# Patient Record
Sex: Female | Born: 1950 | Race: White | Hispanic: No | Marital: Married | Smoking: Never smoker
Health system: Southern US, Community
[De-identification: ages and names within clinical notes are randomized; demographics above are authoritative.]

## PROBLEM LIST (undated history)

## (undated) DIAGNOSIS — E785 Hyperlipidemia, unspecified: Secondary | ICD-10-CM

## (undated) DIAGNOSIS — I2583 Coronary atherosclerosis due to lipid rich plaque: Secondary | ICD-10-CM

## (undated) DIAGNOSIS — I2510 Atherosclerotic heart disease of native coronary artery without angina pectoris: Secondary | ICD-10-CM

## (undated) DIAGNOSIS — R002 Palpitations: Secondary | ICD-10-CM

## (undated) HISTORY — DX: Palpitations: R00.2

## (undated) HISTORY — DX: Hyperlipidemia, unspecified: E78.5

## (undated) HISTORY — DX: Coronary atherosclerosis due to lipid rich plaque: I25.83

## (undated) HISTORY — DX: Atherosclerotic heart disease of native coronary artery without angina pectoris: I25.10

---

## 1998-10-15 ENCOUNTER — Encounter: Payer: Self-pay | Admitting: Family Medicine

## 1998-10-15 ENCOUNTER — Ambulatory Visit (HOSPITAL_COMMUNITY): Admission: RE | Admit: 1998-10-15 | Discharge: 1998-10-15 | Payer: Self-pay | Admitting: Family Medicine

## 1999-03-24 ENCOUNTER — Encounter (INDEPENDENT_AMBULATORY_CARE_PROVIDER_SITE_OTHER): Payer: Self-pay

## 1999-03-24 ENCOUNTER — Other Ambulatory Visit: Admission: RE | Admit: 1999-03-24 | Discharge: 1999-03-24 | Payer: Self-pay | Admitting: Gastroenterology

## 1999-06-12 ENCOUNTER — Emergency Department (HOSPITAL_COMMUNITY): Admission: EM | Admit: 1999-06-12 | Discharge: 1999-06-12 | Payer: Self-pay | Admitting: Emergency Medicine

## 1999-06-12 ENCOUNTER — Encounter: Payer: Self-pay | Admitting: Emergency Medicine

## 1999-07-07 ENCOUNTER — Ambulatory Visit (HOSPITAL_COMMUNITY): Admission: RE | Admit: 1999-07-07 | Discharge: 1999-07-08 | Payer: Self-pay | Admitting: *Deleted

## 1999-07-07 HISTORY — PX: CARDIAC CATHETERIZATION: SHX172

## 1999-07-10 ENCOUNTER — Ambulatory Visit (HOSPITAL_COMMUNITY): Admission: RE | Admit: 1999-07-10 | Discharge: 1999-07-11 | Payer: Self-pay | Admitting: *Deleted

## 1999-07-10 HISTORY — PX: CARDIAC CATHETERIZATION: SHX172

## 2000-10-08 ENCOUNTER — Other Ambulatory Visit: Admission: RE | Admit: 2000-10-08 | Discharge: 2000-10-08 | Payer: Self-pay | Admitting: *Deleted

## 2001-01-31 ENCOUNTER — Other Ambulatory Visit: Admission: RE | Admit: 2001-01-31 | Discharge: 2001-01-31 | Payer: Self-pay | Admitting: *Deleted

## 2002-01-24 ENCOUNTER — Other Ambulatory Visit: Admission: RE | Admit: 2002-01-24 | Discharge: 2002-01-24 | Payer: Self-pay | Admitting: *Deleted

## 2002-06-20 ENCOUNTER — Ambulatory Visit (HOSPITAL_COMMUNITY): Admission: RE | Admit: 2002-06-20 | Discharge: 2002-06-20 | Payer: Self-pay | Admitting: Gastroenterology

## 2006-02-16 ENCOUNTER — Emergency Department (HOSPITAL_COMMUNITY): Admission: EM | Admit: 2006-02-16 | Discharge: 2006-02-16 | Payer: Self-pay | Admitting: Emergency Medicine

## 2006-04-03 ENCOUNTER — Encounter: Admission: RE | Admit: 2006-04-03 | Discharge: 2006-04-03 | Payer: Self-pay | Admitting: Family Medicine

## 2006-11-11 ENCOUNTER — Ambulatory Visit (HOSPITAL_BASED_OUTPATIENT_CLINIC_OR_DEPARTMENT_OTHER): Admission: RE | Admit: 2006-11-11 | Discharge: 2006-11-11 | Payer: Self-pay | Admitting: Orthopedic Surgery

## 2009-05-24 HISTORY — PX: CARDIOVASCULAR STRESS TEST: SHX262

## 2009-05-30 ENCOUNTER — Ambulatory Visit (HOSPITAL_COMMUNITY): Admission: RE | Admit: 2009-05-30 | Discharge: 2009-05-30 | Payer: Self-pay | Admitting: Obstetrics
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
## 2010-09-05 NOTE — Op Note (Signed)
NAME:  Anne Gaines, Anne Gaines                          ACCOUNT NO.:  [id number]   MEDICAL RECORD NO.:  [id number]                   PATIENT TYPE:  AMB   LOCATION:  ENDO                                 FACILITY:  [facility]   PHYSICIAN:  John C. Madilyn Fireman, M.D.                 DATE OF BIRTH:  July 08, 1950   DATE OF PROCEDURE:  06/20/2002  DATE OF DISCHARGE:                                 OPERATIVE REPORT   PROCEDURE:  Colonoscopy.   INDICATION FOR PROCEDURE:  Colon cancer screening in a 60 year old patient  who has also had bloating, constipation, and abdominal cramps.  Negative GYN  workup.   DESCRIPTION OF PROCEDURE:  The patient was placed in the left lateral  decubitus position and placed on the pulse monitor with continuous low-flow  oxygen delivered by nasal cannula oxygen.  She was sedated with 75 mcg IV  fentanyl and 8 mg IV Versed.  Once the video colonoscope was inserted into  the rectum and advanced to probably about the hepatic flexure, but the  procedure was incomplete due to poor and worsening prep and visualization  the further the scope was advanced toward the right colon.  Prep was so poor  in that area with non-transparent liquid stool as well as particulate matter  that could not be adequately removed, that I could not even tell for sure  whether I was in the cecum or not but could not clearly seen an ileocecal  valve.  I may have been at the hepatic flexure.  There as so much stool and  we were at the end of the cecum but there was such a sharp bend with stool  in front of it that I would not maintain an adequate view to pass the scope  further.  It was felt that we were probably about at the hepatic flexure.  The most proximal visualized portions of the colon were grossly normal,  severely limited by prep.  The prep improved as the scope was withdrawn more  into the descending and sigmoid colon and rectum, but I could not rule out  small lesions in the sigmoid and descending  colon less than 1 cm due to  limitations in prep as well.  No diverticula were noted.  The scope was then  withdrawn and the patient returned to the recovery room in stable condition.  She tolerated the procedure well and there were no immediate complications.   IMPRESSION:  Basically normal study, incomplete, estimated to the hepatic  flexure due to very poor prep.   PLAN:  Given poor prep and the patient's symptoms, I think she probably has  constipation-predominant irritable bowel syndrome, and will try her on a  course of Zelnorm.  Will consider a follow-up barium enema at some point for  screening.  John C. Madilyn Fireman, M.D.    JCH/MEDQ  D:  06/20/2002  T:  06/20/2002  Job:  [id number]   cc:   Georgina Peer, M.D.  [address]  [place]  [place] [address]  Fax: [phone]

## 2010-09-05 NOTE — Cardiovascular Report (Signed)
[place]. [facility]  Patient:    Anne Gaines, Anne Gaines                         MRN: [id number] Adm. Date:  [id number] Attending:  Mora Appl CC:         Meredith Staggers, M.D.                        Cardiac Catheterization  REFERRING PHYSICIAN:  Meredith Staggers, M.D.  INDICATION FOR PROCEDURE:  Recently status post angioplasty with stent deployment in the proximal LAD and recurrent chest pain.  DESCRIPTION OF PROCEDURE:  After obtaining written informed consent, the patient was brought to the cardiac catheterization lab in a post-absorptive state. Preop sedation was achieved using IV Versed.  The left femoral head was identified using radiographic technique.  The left femoral region was then prepped and draped in the usual sterile fashion.  Local anesthesia was achieved using 1% Xylocaine.  A 6-French hemostasis sheath was placed into the left femoral artery using a modified Seldinger technique.  Selective coronary angiography was performed using a JL4 nd a 5-French JR4 Judkins catheter.  Nonionic contrast was used and was hand-injected for the coronaries.  All catheter exchanges were made over a guidewire and the hemostasis sheath was flushed after each catheter exchange.  The films were reviewed.  There was no critical disease noted.  FINDINGS:  The aortic pressure was 90/60.  Fluoroscopy did not reveal any significant calcification of the coronaries.  CORONARY ANGIOGRAPHY:  The left main coronary artery bifurcated into the left anterior descending and the circumflex vessel.  There was no significant disease in the left main coronary artery.  Left anterior descending:  The left anterior descending gave rise to a small D-1, D-2, a large bifurcating D-3 and ended as a large apical recurrent branch.  The  previously stented region in the proximal LAD was patent.  The first septal perforators had a 90% ostial lesion and had decreased  flow.  Circumflex vessel:  The circumflex vessel gave rise to a small OM-1, a large OM-2, OM-3 and a moderate-sized OM-4.  There was no significant disease in the circumflex or its branches.  Right coronary artery:  The right coronary artery was a 2.0- to 2.5-mm size vessel. There was initial spasm with a 6-French catheter.  Sublingual spray of nitroglycerin and intracoronary nitroglycerin were given.  The right coronary artery was then engaged using the JR 5-French catheter.  There was no significant disease in the right coronary artery or its branches.  IMPRESSION:  Recurrent chest pain most likely related to the first septal perforator attempting to close.  The previously stented left anterior descending remained patent with excellent blood flow.  RECOMMENDATION:  Medical management. DD:  07/10/99 TD:  07/10/99 Job: [id number]

## 2010-09-05 NOTE — Cardiovascular Report (Signed)
[place]. [facility]  Patient:    Anne Gaines, Anne Gaines                         MRN: [id number] Adm. Date:  [id number] Attending:  Mora Appl CC:         Meredith Staggers, M.D.                        Cardiac Catheterization  REFERRING PHYSICIAN:  Meredith Staggers, M.D.  INDICATION FOR PROCEDURE:  A 60 year old female complains of substernal chest pain and reversible uptake in the anterior/anteroapical region on the Cardiolite image.  DESCRIPTION OF PROCEDURE:  After obtaining written informed consent, the patient was brought to the cardiac catheterization lab in a post-absorptive state. Preop sedation was achieved using IV Versed.  The right femoral head was identified using radiographic technique.  The right femoral region was then prepped and draped in the usual sterile fashion.  Local anesthesia was achieved using 1% Xylocaine.  6-French hemostasis sheath was placed into the right femoral artery using a modified Seldinger technique.  A single-plane ventriculogram was performed in the RAO position using a 6-French pigtail curved catheter.  Nonionic contrast was used and was power-injected for a total of 30 cc.  Selective coronary angiography was performed using the JL4 and JR4 Judkins catheters.  Nonionic contrast was used nd was hand-injected.  All catheter exchanges were made over a guidewire.  The hemostasis sheath was flashed after each catheter exchange.  Following review of the films, it was felt that there was a critical lesion involving the proximal AD. Dr.John Franco Nones was consulted and reviewed the films.  The patient was left prepped and draped for Dr. Amil Amen to proceed with angioplasty of the LAD.  FINDINGS:  The aortic pressure was 99/60.  LV pressure 95/4.  There was no gradient noted on pullback.  Single-plane ventriculogram revealed normal wall motion with no mitral regurgitation noted.  CORONARY ANGIOGRAPHY:  The  left main coronary artery bifurcated into the left anterior descending and circumflex vessel.  There was no significant disease in the left main coronary artery.  Left anterior descending:  Left anterior descending gave rise to a small D-1, small D-2, large bifurcating D-3 and ended as a large apical recurrent branch.  There was an 80% proximal lesion in the LAD, involving the first septal perforator.  The ostium of the septal perforator appeared to have an 80-90% lesion.  Circumflex vessel:  Circumflex vessel gave rise to a small OM-1, a large OM-2, M-3 and a moderate-size OM-4.  There was no significant disease in the circumflex or its branches.  Right coronary artery:  The right coronary artery was a 2.5-mm size vessel and ave rise to a small RV marginal 1, 2, and 3 and a moderate-size RV marginal #4 and V marginal #5 was also moderate in size.  The PDA and PL branch were rather small in size.  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  RECOMMENDATION:  Dr. Amil Amen will proceed with angioplasty of the LAD.  There were no regional wall motion abnormalities noted. DD:  07/07/99 TD:  07/07/99 Job: [id number]

## 2014-11-13 ENCOUNTER — Ambulatory Visit (INDEPENDENT_AMBULATORY_CARE_PROVIDER_SITE_OTHER): Payer: Self-pay | Admitting: Cardiovascular Disease

## 2014-11-13 ENCOUNTER — Encounter: Payer: Self-pay | Admitting: Cardiovascular Disease

## 2014-11-13 DIAGNOSIS — E785 Hyperlipidemia, unspecified: Secondary | ICD-10-CM

## 2014-11-13 DIAGNOSIS — Z79899 Other long term (current) drug therapy: Secondary | ICD-10-CM

## 2014-11-13 DIAGNOSIS — R002 Palpitations: Secondary | ICD-10-CM

## 2014-11-13 DIAGNOSIS — I2510 Atherosclerotic heart disease of native coronary artery without angina pectoris: Secondary | ICD-10-CM

## 2014-11-13 NOTE — Assessment & Plan Note (Signed)
History of hyperlipidemia not on statin therapy currently but with a prescription for that 5 years ago which apparently she never filled. She does have a history of ischemic heart disease. Her last LDL in our chart performed 05/23/09 was 131. I'm going to recheck a lipid and liver profile

## 2014-11-13 NOTE — Progress Notes (Signed)
     11/13/2014 Anne Gaines   [birth year]  [id number]  Primary Physician No primary care provider on file. Primary Cardiologist: Lorretta Harp MD [name]   HPI:  Anne Gaines is a 64 year old moderately overweight married Caucasian female whose husband is also a patient mind. I last saw her in the office 06/25/09. She has a history of CAD status post LAD stenting by Dr. Rodell Perna 2001. Other problems include hyperlipidemia. She denies chest pain or shortness of breath. Her last Myoview stress test performed 05/24/09 was nonischemic. She apparently was bitten by for ticks back in April and then developed episodes of tachycardia palpitations which peaked in June and have since resolved. She is worried about the possibility of Lyme disease and Lyme carditis.   Current Outpatient Prescriptions  Medication Sig Dispense Refill  . aspirin 81 MG tablet Take 81 mg by mouth daily.     No current facility-administered medications for this visit.    No Known Allergies  History   Social History  . Marital Status: Married    Spouse Name: N/A  . Number of Children: N/A  . Years of Education: N/A   Occupational History  . Not on file.   Social History Main Topics  . Smoking status: Never Smoker   . Smokeless tobacco: Not on file  . Alcohol Use: No  . Drug Use: No  . Sexual Activity: Not on file   Other Topics Concern  . Not on file   Social History Narrative     Review of Systems: General: negative for chills, fever, night sweats or weight changes.  Cardiovascular: negative for chest pain, dyspnea on exertion, edema, orthopnea, palpitations, paroxysmal nocturnal dyspnea or shortness of breath Dermatological: negative for rash Respiratory: negative for cough or wheezing Urologic: negative for hematuria Abdominal: negative for nausea, vomiting, diarrhea, bright red blood per rectum, melena, or hematemesis Neurologic: negative for visual changes, syncope, or  dizziness All other systems reviewed and are otherwise negative except as noted above.    Blood pressure 146/76, pulse 68, height 5\' 9"  (1.753 m), weight 195 lb 5 oz (88.593 kg).  General appearance: alert and no distress Neck: no adenopathy, no carotid bruit, no JVD, supple, symmetrical, trachea midline and thyroid not enlarged, symmetric, no tenderness/mass/nodules Lungs: clear to auscultation bilaterally Heart: regular rate and rhythm, S1, S2 normal, no murmur, click, rub or gallop Extremities: extremities normal, atraumatic, no cyanosis or edema  EKG normal sinus rhythm at 68 without ST or T-wave changes. I personally reviewed this EKG  ASSESSMENT AND PLAN:   Palpitations History of palpitations that began after several tick bites back in the spring. The symptoms were more noticeable last month but they have since resolved. I am worried about Lyme carditis. I'm going to order 2-D echo for LV function and get serum studies for Lyme disease.  Hyperlipidemia History of hyperlipidemia not on statin therapy currently but with a prescription for that 5 years ago which apparently she never filled. She does have a history of ischemic heart disease. Her last LDL in our chart performed 05/23/09 was 131. I'm going to recheck a lipid and liver profile  Coronary artery disease History of LAD stenting by Dr. Quay Burow back in 2001. She denies chest pain or shortness of breath.      Lorretta Harp MD FACP,FACC,FAHA, [facility] 11/13/2014 9:40 AM

## 2014-11-13 NOTE — Addendum Note (Signed)
Addended byChauncy Lean. on: 11/13/2014 02:02 PM   Modules accepted: Orders

## 2014-11-13 NOTE — Patient Instructions (Signed)
Your physician has requested that you have an echocardiogram. Echocardiography is a painless test that uses sound waves to create images of your heart. It provides your doctor with information about the size and shape of your heart and how well your heart's chambers and valves are working. This procedure takes approximately one hour. There are no restrictions for this procedure.  Your physician recommends that you return for lab work at your earliest [place].  Dr Gwenlyn Found recommends that you schedule a follow-up appointment in 1 year. You will receive a reminder letter in the mail two months in advance. If you don't receive a letter, please call our office to schedule the follow-up appointment.

## 2014-11-13 NOTE — Assessment & Plan Note (Signed)
History of palpitations that began after several tick bites back in the spring. The symptoms were more noticeable last month but they have since resolved. I am worried about Lyme carditis. I'm going to order 2-D echo for LV function and get serum studies for Lyme disease.

## 2014-11-13 NOTE — Assessment & Plan Note (Signed)
History of LAD stenting by Dr. Quay Burow back in 2001. She denies chest pain or shortness of breath.

## 2014-11-15 ENCOUNTER — Telehealth: Payer: Self-pay | Admitting: Cardiovascular Disease

## 2014-11-15 NOTE — Telephone Encounter (Signed)
Returning a call from [place]

## 2014-11-15 NOTE — Telephone Encounter (Signed)
Patient provided with the following information - Referral to Dr. Bobby Rumpf, Infectious Diseases, was sent on 7/26. Called their office [phone]) to confirm that patient's referral was in process. Verified that the MD is reviewing request. If referral accepted, they will call the patient to schedule appointment.  If referral request not accepted, they will call Dr. Kennon Holter office back to inform him. Patient verbalized understanding and will await phone call from Dr. Algis Downs office.

## 2014-11-15 NOTE — Telephone Encounter (Signed)
Pt thought the nurse was going to make her an appt  with an infectious disease doctor.This was so they could check to see if she had lyme disease.

## 2014-11-15 NOTE — Telephone Encounter (Signed)
LMTCB 7/28

## 2014-11-26 ENCOUNTER — Telehealth: Payer: Self-pay | Admitting: Cardiovascular Disease

## 2014-11-26 DIAGNOSIS — W57XXXA Bitten or stung by nonvenomous insect and other nonvenomous arthropods, initial encounter: Secondary | ICD-10-CM

## 2014-11-26 DIAGNOSIS — R002 Palpitations: Secondary | ICD-10-CM

## 2014-11-26 NOTE — Telephone Encounter (Signed)
Pt called in stating that she was referred to an Infectious Disease doctor for her [place] Lyme disease . But she was told that she had to have her blood work done here before going to see the ID doctor. She would like to know if those orders could be put in by Thursday when goes to have her other labs done. Please f/u  Thanks

## 2014-11-26 NOTE — Telephone Encounter (Signed)
Patient notified lab has been ordered to be done at [facility]. She will have her lipid/liver panel done at the same time. She is aware she will need to be fasting.

## 2014-11-26 NOTE — Telephone Encounter (Signed)
Message routed to Dr. Gwenlyn Found and Curt Bears, RN to advise on lab work

## 2014-11-26 NOTE — Telephone Encounter (Signed)
Order placed for the lab work.

## 2014-11-29 ENCOUNTER — Encounter: Payer: Self-pay | Admitting: *Deleted

## 2014-11-29 ENCOUNTER — Encounter (INDEPENDENT_AMBULATORY_CARE_PROVIDER_SITE_OTHER): Payer: Self-pay

## 2014-11-29 ENCOUNTER — Other Ambulatory Visit: Payer: Self-pay

## 2014-11-29 ENCOUNTER — Ambulatory Visit (HOSPITAL_COMMUNITY): Payer: Self-pay | Attending: Internal Medicine

## 2014-11-29 DIAGNOSIS — Z8249 Family history of ischemic heart disease and other diseases of the circulatory system: Secondary | ICD-10-CM | POA: Insufficient documentation

## 2014-11-29 DIAGNOSIS — R002 Palpitations: Secondary | ICD-10-CM

## 2014-11-29 DIAGNOSIS — I5189 Other ill-defined heart diseases: Secondary | ICD-10-CM | POA: Insufficient documentation

## 2014-11-29 DIAGNOSIS — E785 Hyperlipidemia, unspecified: Secondary | ICD-10-CM | POA: Insufficient documentation

## 2014-11-30 ENCOUNTER — Other Ambulatory Visit: Payer: Self-pay | Admitting: Cardiovascular Disease

## 2014-12-01 LAB — LIPID PANEL
CHOLESTEROL: 237 mg/dL — AB (ref 125–200)
HDL: 50 mg/dL (ref >=46)
LDL CALC: 155 mg/dL — AB (ref <130)
TRIGLYCERIDES: 158 mg/dL — AB (ref <150)
Total CHOL/HDL Ratio: 4.7 Ratio (ref <=5.0)
VLDL: 32 mg/dL — AB (ref <30)

## 2014-12-01 LAB — HEPATIC FUNCTION PANEL
ALBUMIN: 4.1 g/dL (ref 3.6–5.1)
ALK PHOS: 58 U/L (ref 33–130)
ALT: 23 U/L (ref 6–29)
AST: 19 U/L (ref 10–35)
BILIRUBIN DIRECT: 0.1 mg/dL (ref <=0.2)
BILIRUBIN TOTAL: 0.6 mg/dL (ref 0.2–1.2)
Indirect Bilirubin: 0.5 mg/dL (ref 0.2–1.2)
Total Protein: 6.6 g/dL (ref 6.1–8.1)

## 2014-12-04 ENCOUNTER — Telehealth: Payer: Self-pay | Admitting: Cardiovascular Disease

## 2014-12-04 DIAGNOSIS — Z79899 Other long term (current) drug therapy: Secondary | ICD-10-CM

## 2014-12-04 DIAGNOSIS — E785 Hyperlipidemia, unspecified: Secondary | ICD-10-CM

## 2014-12-04 LAB — LYME ABY, WSTRN BLT IGG & IGM W/BANDS
B BURGDORFERI IGM ABS (IB): NEGATIVE
B burgdorferi IgG Abs (IB): NEGATIVE
LYME DISEASE 18 KD IGG: NONREACTIVE
LYME DISEASE 23 KD IGG: NONREACTIVE
LYME DISEASE 23 KD IGM: NONREACTIVE
LYME DISEASE 39 KD IGM: NONREACTIVE
LYME DISEASE 41 KD IGG: NONREACTIVE
LYME DISEASE 58 KD IGG: NONREACTIVE
Lyme Disease 28 kD IgG: NONREACTIVE
Lyme Disease 30 kD IgG: NONREACTIVE
Lyme Disease 39 kD IgG: NONREACTIVE
Lyme Disease 41 kD IgM: NONREACTIVE
Lyme Disease 45 kD IgG: NONREACTIVE
Lyme Disease 66 kD IgG: NONREACTIVE
Lyme Disease 93 kD IgG: NONREACTIVE

## 2014-12-04 NOTE — Telephone Encounter (Signed)
Patient said someone from here called her today between 1:30 pm and 2pm.  She is returning call.  I don't see where we have called her.

## 2014-12-04 NOTE — Telephone Encounter (Signed)
Mrs.Picou is returning the call . Please call . Thanks

## 2014-12-05 MED ORDER — ATORVASTATIN CALCIUM 40 MG PO TABS: 40.0000 mg | ORAL_TABLET | Freq: Every day | ORAL | Status: DC

## 2014-12-05 NOTE — Telephone Encounter (Signed)
Patient was returning call regarding lab results.   Spoke with her and notified her of results/med instructions and that labs would need to be repeated in 2 months. She voiced understanding.

## 2014-12-10 ENCOUNTER — Encounter: Payer: Self-pay | Admitting: Cardiovascular Disease

## 2014-12-13 ENCOUNTER — Telehealth: Payer: Self-pay | Admitting: *Deleted

## 2014-12-13 NOTE — Telephone Encounter (Signed)
No need to see, her Lyme serologies are all negative/normal.         Thanks    jeff    LM for patient to call me regarding her test results.

## 2014-12-20 ENCOUNTER — Encounter: Payer: Self-pay | Admitting: *Deleted

## 2014-12-20 NOTE — Telephone Encounter (Signed)
Message sent to patient through my chart.  

## 2014-12-21 ENCOUNTER — Telehealth: Payer: Self-pay | Admitting: Cardiovascular Disease

## 2014-12-21 NOTE — Telephone Encounter (Signed)
Patient reports all the acute issues she has been having following her tick bite in April have abated.  Is there anything further she is recommended to do other than routine f/u visit?

## 2014-12-21 NOTE — Telephone Encounter (Signed)
Pt called and said echo came back normal,lab work was good,and palpitations have stopped.What is the next step or recommendation?

## 2014-12-24 NOTE — Telephone Encounter (Signed)
Nothing further.

## 2014-12-25 NOTE — Telephone Encounter (Signed)
Dr. Kennon Holter recommendations communicated to patient.

## 2016-03-31 ENCOUNTER — Ambulatory Visit: Payer: Self-pay | Admitting: Cardiovascular Disease

## 2016-05-01 ENCOUNTER — Ambulatory Visit (INDEPENDENT_AMBULATORY_CARE_PROVIDER_SITE_OTHER): Payer: Medicare Other | Admitting: Cardiovascular Disease

## 2016-05-01 ENCOUNTER — Encounter: Payer: Self-pay | Admitting: Cardiovascular Disease

## 2016-05-01 VITALS — BP 133/76 | HR 65 | Ht 69.0 in | Wt 189.2 lb

## 2016-05-01 DIAGNOSIS — E785 Hyperlipidemia, unspecified: Secondary | ICD-10-CM

## 2016-05-01 DIAGNOSIS — I2583 Coronary atherosclerosis due to lipid rich plaque: Secondary | ICD-10-CM | POA: Diagnosis not present

## 2016-05-01 DIAGNOSIS — I2510 Atherosclerotic heart disease of native coronary artery without angina pectoris: Secondary | ICD-10-CM

## 2016-05-01 LAB — LIPID PANEL
CHOL/HDL RATIO: 4.4 ratio (ref <5.0)
Cholesterol: 237 mg/dL — ABNORMAL HIGH (ref <200)
HDL: 54 mg/dL (ref >50)
LDL Cholesterol: 152 mg/dL — ABNORMAL HIGH (ref <100)
Triglycerides: 157 mg/dL — ABNORMAL HIGH (ref <150)
VLDL: 31 mg/dL — AB (ref <30)

## 2016-05-01 LAB — HEPATIC FUNCTION PANEL
ALT: 22 U/L (ref 6–29)
AST: 17 U/L (ref 10–35)
Albumin: 4.3 g/dL (ref 3.6–5.1)
Alkaline Phosphatase: 66 U/L (ref 33–130)
BILIRUBIN DIRECT: 0.1 mg/dL (ref <=0.2)
BILIRUBIN TOTAL: 0.6 mg/dL (ref 0.2–1.2)
Indirect Bilirubin: 0.5 mg/dL (ref 0.2–1.2)
Total Protein: 6.8 g/dL (ref 6.1–8.1)

## 2016-05-01 NOTE — Assessment & Plan Note (Signed)
History of CAD status post LAD stenting by Dr. Rodell Perna in 2001. Her last Myoview performed 05/24/09 was nonischemic. She denies chest pain or shortness of breath.

## 2016-05-01 NOTE — Patient Instructions (Signed)

## 2016-05-01 NOTE — Assessment & Plan Note (Signed)
History of hyperlipidemia currently not on a statin drug to her left liver profile performed 11/30/14 revealed total cholesterol 237, LDL 155 and HDL of 50. We will recheck a lipid and liver profile

## 2016-05-01 NOTE — Progress Notes (Signed)
     05/01/2016 Anne Gaines   1950-08-01  [id number]  Primary Physician No primary care provider on file. Primary Cardiologist: Anne Harp MD Anne Gaines  HPI:  Anne Gaines is a 66 year old moderately overweight married Caucasian female whose husband Anne Gaines is also a patient mind. I last saw her in the office 06/25/09. She has a history of CAD status post LAD stenting by Dr. Rodell Gaines 2001. Other problems include hyperlipidemia. She denies chest pain or shortness of breath. Her last Myoview stress test performed 05/24/09 was nonischemic. She apparently was bitten by for ticks back in April 2016 and then developed episodes of tachycardia palpitations which peaked in June and have since resolved. Since I saw her last she denies chest pain or shortness of breath.   Current Outpatient Prescriptions  Medication Sig Dispense Refill  . aspirin 81 MG tablet Take 81 mg by mouth daily.     No current facility-administered medications for this visit.     No Known Allergies  Social History   Social History  . Marital status: Married    Spouse name: N/A  . Number of children: N/A  . Years of education: N/A   Occupational History  . Not on file.   Social History Main Topics  . Smoking status: Never Smoker  . Smokeless tobacco: Not on file  . Alcohol use No  . Drug use: No  . Sexual activity: Not on file   Other Topics Concern  . Not on file   Social History Narrative  . No narrative on file     Review of Systems: General: negative for chills, fever, night sweats or weight changes.  Cardiovascular: negative for chest pain, dyspnea on exertion, edema, orthopnea, palpitations, paroxysmal nocturnal dyspnea or shortness of breath Dermatological: negative for rash Respiratory: negative for cough or wheezing Urologic: negative for hematuria Abdominal: negative for nausea, vomiting, diarrhea, bright red blood per rectum, melena, or hematemesis Neurologic: negative for  visual changes, syncope, or dizziness All other systems reviewed and are otherwise negative except as noted above.    Blood pressure 133/76, pulse 65, height 5\' 9"  (1.753 m), weight 189 lb 3.2 oz (85.8 kg).  General appearance: alert and no distress Neck: no adenopathy, no carotid bruit, no JVD, supple, symmetrical, trachea midline and thyroid not enlarged, symmetric, no tenderness/mass/nodules Lungs: clear to auscultation bilaterally Heart: regular rate and rhythm, S1, S2 normal, no murmur, click, rub or gallop Extremities: extremities normal, atraumatic, no cyanosis or edema  EKG sinus rhythm at 65 without ST or T-wave changes.. I personally reviewed this EKG  ASSESSMENT AND PLAN:   Coronary artery disease History of CAD status post LAD stenting by Dr. Rodell Gaines in 2001. Her last Myoview performed 05/24/09 was nonischemic. She denies chest pain or shortness of breath.  Hyperlipidemia History of hyperlipidemia currently not on a statin drug to her left liver profile performed 11/30/14 revealed total cholesterol 237, LDL 155 and HDL of 50. We will recheck a lipid and liver profile      Anne Harp MD [facility], [facility] 05/01/2016 8:23 AM

## 2016-05-05 ENCOUNTER — Telehealth: Payer: Self-pay | Admitting: Cardiovascular Disease

## 2016-05-05 NOTE — Telephone Encounter (Signed)
New Message     Returning your call about the lab results

## 2016-05-11 ENCOUNTER — Other Ambulatory Visit: Payer: Self-pay | Admitting: Cardiovascular Disease

## 2016-05-11 DIAGNOSIS — E785 Hyperlipidemia, unspecified: Secondary | ICD-10-CM

## 2016-05-11 MED ORDER — ATORVASTATIN CALCIUM 40 MG PO TABS: 40.0000 mg | ORAL_TABLET | Freq: Every day | ORAL | 3 refills | Status: AC

## 2016-05-11 NOTE — Telephone Encounter (Signed)
Spoke to pt. Pt verbalized understanding. Per Dr. Kennon Holter instruction, sent in Rx for Lipitor 40 mg to [facility] on [facility] [address]. Dahlia Bailiff lab slips to pt to have labs rechecked in 3 months. Will refer to pharmacy if pt is statin intolerant.

## 2016-06-02 DIAGNOSIS — L259 Unspecified contact dermatitis, unspecified cause: Secondary | ICD-10-CM | POA: Diagnosis not present

## 2016-09-12 ENCOUNTER — Emergency Department (HOSPITAL_COMMUNITY)
Admission: EM | Admit: 2016-09-12 | Discharge: 2016-09-12 | Disposition: A | Discharge status: Home / Self Care | Payer: Medicare Other | Attending: Emergency Medicine | Admitting: Emergency Medicine

## 2016-09-12 ENCOUNTER — Emergency Department (HOSPITAL_COMMUNITY): Payer: Medicare Other

## 2016-09-12 ENCOUNTER — Encounter (HOSPITAL_COMMUNITY): Payer: Self-pay | Admitting: Emergency Medicine

## 2016-09-12 DIAGNOSIS — Y999 Unspecified external cause status: Secondary | ICD-10-CM | POA: Diagnosis not present

## 2016-09-12 DIAGNOSIS — S52502A Unspecified fracture of the lower end of left radius, initial encounter for closed fracture: Secondary | ICD-10-CM | POA: Insufficient documentation

## 2016-09-12 DIAGNOSIS — S52612A Displaced fracture of left ulna styloid process, initial encounter for closed fracture: Secondary | ICD-10-CM

## 2016-09-12 DIAGNOSIS — Y9301 Activity, walking, marching and hiking: Secondary | ICD-10-CM | POA: Diagnosis not present

## 2016-09-12 DIAGNOSIS — Y92017 Garden or yard in single-family (private) house as the place of occurrence of the external cause: Secondary | ICD-10-CM | POA: Diagnosis not present

## 2016-09-12 DIAGNOSIS — S6992XA Unspecified injury of left wrist, hand and finger(s), initial encounter: Secondary | ICD-10-CM | POA: Diagnosis present

## 2016-09-12 DIAGNOSIS — W01198A Fall on same level from slipping, tripping and stumbling with subsequent striking against other object, initial encounter: Secondary | ICD-10-CM | POA: Diagnosis not present

## 2016-09-12 DIAGNOSIS — Z7982 Long term (current) use of aspirin: Secondary | ICD-10-CM | POA: Diagnosis not present

## 2016-09-12 DIAGNOSIS — S52592A Other fractures of lower end of left radius, initial encounter for closed fracture: Secondary | ICD-10-CM | POA: Diagnosis not present

## 2016-09-12 DIAGNOSIS — S52602A Unspecified fracture of lower end of left ulna, initial encounter for closed fracture: Secondary | ICD-10-CM | POA: Diagnosis not present

## 2016-09-12 MED ORDER — NAPROXEN 500 MG PO TABS: 500.0000 mg | ORAL_TABLET | Freq: Two times a day (BID) | ORAL | 0 refills | Status: AC | PRN

## 2016-09-12 MED ORDER — HYDROCODONE-ACETAMINOPHEN 5-325 MG PO TABS: 1.0000 | ORAL_TABLET | Freq: Four times a day (QID) | ORAL | 0 refills | Status: AC | PRN

## 2016-09-12 MED ORDER — HYDROCODONE-ACETAMINOPHEN 5-325 MG PO TABS
1.0000 | ORAL_TABLET | Freq: Once | ORAL | Status: AC
Start: 2016-09-12 — End: 2016-09-12
  Administered 2016-09-12: 1 via ORAL
  Filled 2016-09-12: qty 1

## 2016-09-12 NOTE — ED Triage Notes (Signed)
Patient c/o left wrist pain after tripping and falling today. Denies head injury and LOC. Swelling noted.

## 2016-09-12 NOTE — Discharge Instructions (Signed)
Wear wrist splint at all times until you see the hand specialist. Ice and elevate wrist throughout the day, using ice pack for no more than 20 minutes every hour.  Alternate between naprosyn and norco as needed for pain relief. Do not drive or operate machinery with pain medication use. Call hand specialist follow up today or tomorrow to schedule followup appointment for Tuesday 09/15/16 at [place] [facility] (call at 8am to make sure they have your appointment scheduled, but they should be expecting you) for ongoing management of your wrist injury. Return to the ER for changes or worsening symptoms.

## 2016-09-12 NOTE — ED Provider Notes (Signed)
[facility] DEPT Provider Note   CSN: [id number] Arrival date & time: 09/12/16  1308  By signing my name below, I, Marcello Moores, attest that this documentation has been prepared under the direction and in the presence of [address] [place], [place] Electronically Signed: Marcello Moores, ED Scribe. 09/12/16. 2:15 PM.  History   Chief Complaint Chief Complaint  Patient presents with  . Arm Injury   The history is provided by the patient and medical records. No language interpreter was used.  Arm Injury   This is a new problem. The current episode started 1 to 2 hours ago. The problem occurs constantly. The problem has been gradually worsening. The pain is present in the left wrist. The quality of the pain is described as aching. The pain is at a severity of 10/10. The pain is severe. Associated symptoms include limited range of motion. Pertinent negatives include no numbness and no tingling. The symptoms are aggravated by activity. She has tried nothing for the symptoms. The treatment provided no relief. There has been a history of trauma.      HPI Comments: Anne Gaines is a 66 y.o. female who presents to the Emergency Department complaining of L wrist injury s/p mechanical fall; states she was in her yard and walking backwards around 12:30pm (~1.5hrs prior to eval), when she tripped over a log and fell onto her L buttock and with her L wrist stretched out behind her. Reports L wrist pain that she describes as 10/10 constant aching, gradually worsening L wrist pain with radiation to the elbow, worse with L wrist movement, and unrelieved with home oil remedies and ice use. No other tx tried PTA. The pt reports associated swelling and bruising at the L wrist. The pt denies head injury or LOC. Not on blood thinners, no known bleeding disorders. Her last meal was at 10:30am today. Pt denies numbness, tingling, focal weakness, head inj, LOC, incontinence of urine/stool, saddle anesthesia/cauda  equina symptoms, or any other complaints/injuries at this time.   Past Medical History:  Diagnosis Date  . Coronary artery disease due to lipid rich plaque   . Hyperlipidemia   . Palpitations     Patient Active Problem List   Diagnosis Date Noted  . Coronary artery disease 11/13/2014  . Hyperlipidemia 11/13/2014  . Palpitations 11/13/2014    Past Surgical History:  Procedure Laterality Date  . CARDIAC CATHETERIZATION  07/07/1999   Critical disease involving the proximal LAD (80%) and ostium of the first circ (80-90%)  . CARDIAC CATHETERIZATION  07/10/1999   No intervention - medication management  . CARDIOVASCULAR STRESS TEST  05/24/2009   normal pattern of perfusion in all regions, no scintipgraphic evidence of inducible myocardial ischemia, EKG negative for ischemia, no ECG changes.    OB History    No data available       Home Medications    Prior to Admission medications   Medication Sig Start Date End Date Taking? Authorizing Provider  aspirin 81 MG tablet Take 81 mg by mouth daily.    [provider]  atorvastatin (LIPITOR) 40 MG tablet Take 1 tablet (40 mg total) by mouth daily. 05/11/16 08/09/16  Lorretta Harp, MD    Family History Family History  Problem Relation Age of Onset  . Heart disease Mother   . Emphysema Mother   . Hypertension Father   . Heart failure Father   . Diabetes Brother   . Hypertension Brother   . Diabetes Brother   .  Hepatitis C Brother   . Emphysema Maternal Grandmother   . Heart Problems Paternal Grandmother   . Heart failure Paternal Grandfather   . Heart Problems Paternal Grandfather     Social History Social History  Substance Use Topics  . Smoking status: Never Smoker  . Smokeless tobacco: Not on file  . Alcohol use No     Allergies   Patient has no known allergies.   Review of Systems Review of Systems  HENT: Negative for facial swelling (no head inj).   Genitourinary: Negative for difficulty  urinating (no incontinence).  Musculoskeletal: Positive for arthralgias and joint swelling. Negative for myalgias.  Skin: Positive for color change (bruising L wrist). Negative for wound.  Allergic/Immunologic: Negative for immunocompromised state.  Neurological: Negative for tingling, syncope, weakness and numbness.  Hematological: Does not bruise/bleed easily.  Psychiatric/Behavioral: Negative for confusion.     Physical Exam Updated Vital Signs BP (!) 144/87 (BP Location: Right Arm)   Pulse 84   Temp 98.4 F (36.9 C) (Oral)   Resp 18   SpO2 97%   Physical Exam  Constitutional: She is oriented to person, place, and time. Vital signs are normal. She appears well-developed and well-nourished.  Non-toxic appearance. No distress.  Afebrile, nontoxic, NAD  HENT:  Head: Normocephalic and atraumatic.  Mouth/Throat: Mucous membranes are normal.  Eyes: Conjunctivae and EOM are normal. Right eye exhibits no discharge. Left eye exhibits no discharge.  Neck: Normal range of motion. Neck supple. No spinous process tenderness and no muscular tenderness present. No neck rigidity. Normal range of motion present.  FROM intact without spinous process TTP, no bony stepoffs or deformities, no paraspinous muscle TTP or muscle spasms. No rigidity or meningeal signs. No bruising or swelling.  Cardiovascular: Normal rate and intact distal pulses.   Pulmonary/Chest: Effort normal. No respiratory distress.  Abdominal: Normal appearance. She exhibits no distension.  Musculoskeletal:       Left wrist: She exhibits decreased range of motion, tenderness, bony tenderness, swelling and crepitus. She exhibits no deformity and no laceration.       Lumbar back: Normal.  C-spine as above, all other spinal levels no TTP without bony stepoffs or deformities. Buttock without bruising or focal bony TTP. Gait steady and nonantalgic. Left wrist with limited ROM due to pain, with moderate TTP to the distal radius and  ulna, +swelling and bruising, slight crepitus felt, but no deformity. Skin intact with no abrasions or lacerations. Grip strength slightly diminished due to pain, sensation grossly intact, distal pulses intact, soft compartments, wiggles all the fingers with ease. Left elbow with no focal tenderness and with FROM intact.   Neurological: She is alert and oriented to person, place, and time. She has normal strength. No sensory deficit.  Skin: Skin is warm, dry and intact. No rash noted.  Psychiatric: She has a normal mood and affect. Her behavior is normal.  Nursing note and vitals reviewed.    ED Treatments / Results   DIAGNOSTIC STUDIES: Oxygen Saturation is 97% on RA, adequate by my interpretation.   COORDINATION OF CARE: 2:04 PM-Discussed next steps with pt. Pt verbalized understanding and is agreeable with the plan.   Labs (all labs ordered are listed, but only abnormal results are displayed) Labs Reviewed - No data to display  EKG  EKG Interpretation None       Radiology Dg Wrist Complete Left  Result Date: 09/12/2016 CLINICAL DATA:  Wrist pain and swelling, tripped over a log while walking in the  yard today EXAM: LEFT WRIST - COMPLETE 3+ VIEW COMPARISON:  None. FINDINGS: Diffuse osseous demineralization. Displaced ulnar styloid fracture. Transverse diaphyseal fracture distal LEFT radius with minimal dorsal displacement and apex volar angulation. Associated soft tissue swelling. No additional fracture, dislocation, or bone destruction. IMPRESSION: Minimally displaced ulnar styloid fracture. Minimally displaced and angulated distal LEFT radial metaphyseal fracture. Electronically Signed   By: Lavonia Dana M.D.   On: 09/12/2016 13:47    Procedures Procedures (including critical care time)  SPLINT APPLICATION Date/Time: 3:38 PM Authorized by: Reece Agar Consent: Verbal consent obtained. Risks and benefits: risks, benefits and alternatives were discussed Consent given  by: patient Splint applied by: orthopedic technician Location details: L wrist Splint type: sugartong Supplies used: orthoglass Post-procedure: The splinted body part was neurovascularly unchanged following the procedure. Patient tolerance: Patient tolerated the procedure well with no immediate complications.     Medications Ordered in ED Medications  HYDROcodone-acetaminophen (NORCO/VICODIN) 5-325 MG per tablet 1 tablet (1 tablet Oral Given 09/12/16 1430)     Initial Impression / Assessment and Plan / ED Course  I have reviewed the triage vital signs and the nursing notes.  Pertinent labs & imaging results that were available during my care of the patient were reviewed by me and considered in my medical decision making (see chart for details).     66 y.o. female here with L wrist pain after mechanical fall backwards after tripping on a log, landing on her buttocks and her L wrist. +Bruising/swelling/tenderness to distal radius and ulna, mild crepitus felt. Limited ROM due to pain. Elbow without tenderness or bruising/swelling, and with FROM intact. All spinal levels nonTTP. All extremities NVI with soft compartments, no s/sx of cord compression, gait steady. Xray of wrist reveals minimally displaced ulnar styloid fx as well as minimally displaced and angulated distal L radial metaphyseal fx; pt declines pain meds right now, will discuss case with hand specialist in order to decide on whether this needs operative repair today vs reduction and splinting or splinting alone, etc. Will reassess shortly.   2:23 PM Dr. Amedeo Plenty returning page, would like [place] to place sugartong splint, no reduction needed, and have her f/up with him on Tuesday 09/15/16 at 9am for further management and planning for likely surgical repair. Pt accepts pain meds now, before getting splint placed. Advised RICE, rx given for pain meds, and advised f/up with Dr. Amedeo Plenty for ongoing management of her injury. I explained the  diagnosis and have given explicit precautions to return to the ER including for any other new or worsening symptoms. The patient understands and accepts the medical plan as it's been dictated and I have answered their questions. Discharge instructions concerning home care and prescriptions have been given. The patient is STABLE and is discharged to home in good condition.   I personally performed the services described in this documentation, which was scribed in my presence. The recorded information has been reviewed and is accurate.    Final Clinical Impressions(s) / ED Diagnoses   Final diagnoses:  Closed fracture of distal end of left radius, unspecified fracture morphology, initial encounter  Closed displaced fracture of styloid process of left ulna, initial encounter    New Prescriptions New Prescriptions   HYDROCODONE-ACETAMINOPHEN (NORCO) 5-325 MG TABLET    Take 1 tablet by mouth every 6 (six) hours as needed for severe pain.   NAPROXEN (NAPROSYN) 500 MG TABLET    Take 1 tablet (500 mg total) by mouth 2 (two) times daily as needed  for mild pain, moderate pain or headache (TAKE WITH MEALS.).     [address] [place], [place], [place] 09/12/16 1456    [facility] Ferguson, MD 09/16/16 1255

## 2016-09-15 DIAGNOSIS — S52592A Other fractures of lower end of left radius, initial encounter for closed fracture: Secondary | ICD-10-CM | POA: Diagnosis not present

## 2016-09-16 ENCOUNTER — Telehealth: Payer: Self-pay | Admitting: Cardiovascular Disease

## 2016-09-16 NOTE — Telephone Encounter (Signed)
Sherry with [facility] calling to get pre-op form filled out asap for emergency surgery 09-17-16 at 100p for  wrist fracture trying to expidit -pls call

## 2016-09-16 NOTE — Telephone Encounter (Signed)
Left message with Judeen Hammans to let her know that clearance has been approved and signed by Dr. Gwenlyn Found. Sent fax to [phone] and asked her to call back to let me know she received it.

## 2016-09-17 DIAGNOSIS — S52592A Other fractures of lower end of left radius, initial encounter for closed fracture: Secondary | ICD-10-CM | POA: Diagnosis not present

## 2016-09-17 DIAGNOSIS — Y999 Unspecified external cause status: Secondary | ICD-10-CM | POA: Diagnosis not present

## 2016-09-17 DIAGNOSIS — G5602 Carpal tunnel syndrome, left upper limb: Secondary | ICD-10-CM | POA: Diagnosis not present

## 2016-09-17 DIAGNOSIS — S52572A Other intraarticular fracture of lower end of left radius, initial encounter for closed fracture: Secondary | ICD-10-CM | POA: Diagnosis not present

## 2016-09-17 DIAGNOSIS — G8918 Other acute postprocedural pain: Secondary | ICD-10-CM | POA: Diagnosis not present

## 2016-09-21 DIAGNOSIS — M545 Low back pain: Secondary | ICD-10-CM | POA: Diagnosis not present

## 2016-09-21 DIAGNOSIS — W1800XA Striking against unspecified object with subsequent fall, initial encounter: Secondary | ICD-10-CM | POA: Diagnosis not present

## 2016-09-21 DIAGNOSIS — S300XXA Contusion of lower back and pelvis, initial encounter: Secondary | ICD-10-CM | POA: Diagnosis not present

## 2016-09-30 DIAGNOSIS — Z4789 Encounter for other orthopedic aftercare: Secondary | ICD-10-CM | POA: Diagnosis not present

## 2016-09-30 DIAGNOSIS — S52592D Other fractures of lower end of left radius, subsequent encounter for closed fracture with routine healing: Secondary | ICD-10-CM | POA: Diagnosis not present

## 2016-10-14 DIAGNOSIS — S52572D Other intraarticular fracture of lower end of left radius, subsequent encounter for closed fracture with routine healing: Secondary | ICD-10-CM | POA: Diagnosis not present

## 2016-10-19 DIAGNOSIS — S62102D Fracture of unspecified carpal bone, left wrist, subsequent encounter for fracture with routine healing: Secondary | ICD-10-CM | POA: Diagnosis not present

## 2016-10-27 DIAGNOSIS — S62102D Fracture of unspecified carpal bone, left wrist, subsequent encounter for fracture with routine healing: Secondary | ICD-10-CM | POA: Diagnosis not present

## 2016-10-28 DIAGNOSIS — S62102D Fracture of unspecified carpal bone, left wrist, subsequent encounter for fracture with routine healing: Secondary | ICD-10-CM | POA: Diagnosis not present

## 2016-10-29 DIAGNOSIS — S62102D Fracture of unspecified carpal bone, left wrist, subsequent encounter for fracture with routine healing: Secondary | ICD-10-CM | POA: Diagnosis not present

## 2016-11-02 DIAGNOSIS — S62102D Fracture of unspecified carpal bone, left wrist, subsequent encounter for fracture with routine healing: Secondary | ICD-10-CM | POA: Diagnosis not present

## 2016-11-06 DIAGNOSIS — S62102A Fracture of unspecified carpal bone, left wrist, initial encounter for closed fracture: Secondary | ICD-10-CM | POA: Diagnosis not present

## 2016-11-10 DIAGNOSIS — S62102D Fracture of unspecified carpal bone, left wrist, subsequent encounter for fracture with routine healing: Secondary | ICD-10-CM | POA: Diagnosis not present

## 2016-11-12 DIAGNOSIS — S62102D Fracture of unspecified carpal bone, left wrist, subsequent encounter for fracture with routine healing: Secondary | ICD-10-CM | POA: Diagnosis not present

## 2016-11-16 DIAGNOSIS — S52572D Other intraarticular fracture of lower end of left radius, subsequent encounter for closed fracture with routine healing: Secondary | ICD-10-CM | POA: Diagnosis not present

## 2016-11-16 DIAGNOSIS — M25532 Pain in left wrist: Secondary | ICD-10-CM | POA: Diagnosis not present

## 2016-11-17 DIAGNOSIS — S62102D Fracture of unspecified carpal bone, left wrist, subsequent encounter for fracture with routine healing: Secondary | ICD-10-CM | POA: Diagnosis not present

## 2016-11-24 DIAGNOSIS — S62102D Fracture of unspecified carpal bone, left wrist, subsequent encounter for fracture with routine healing: Secondary | ICD-10-CM | POA: Diagnosis not present

## 2016-11-26 DIAGNOSIS — S62102D Fracture of unspecified carpal bone, left wrist, subsequent encounter for fracture with routine healing: Secondary | ICD-10-CM | POA: Diagnosis not present

## 2016-12-01 DIAGNOSIS — S62102D Fracture of unspecified carpal bone, left wrist, subsequent encounter for fracture with routine healing: Secondary | ICD-10-CM | POA: Diagnosis not present

## 2016-12-08 DIAGNOSIS — S62102A Fracture of unspecified carpal bone, left wrist, initial encounter for closed fracture: Secondary | ICD-10-CM | POA: Diagnosis not present

## 2016-12-09 DIAGNOSIS — S52572D Other intraarticular fracture of lower end of left radius, subsequent encounter for closed fracture with routine healing: Secondary | ICD-10-CM | POA: Diagnosis not present

## 2016-12-15 DIAGNOSIS — S62102D Fracture of unspecified carpal bone, left wrist, subsequent encounter for fracture with routine healing: Secondary | ICD-10-CM | POA: Diagnosis not present

## 2017-02-18 DIAGNOSIS — H6122 Impacted cerumen, left ear: Secondary | ICD-10-CM | POA: Diagnosis not present

## 2017-02-18 DIAGNOSIS — H9483 Other specified disorders of ear in diseases classified elsewhere, bilateral: Secondary | ICD-10-CM | POA: Diagnosis not present

## 2017-02-18 DIAGNOSIS — L299 Pruritus, unspecified: Secondary | ICD-10-CM | POA: Diagnosis not present

## 2017-02-18 DIAGNOSIS — H9313 Tinnitus, bilateral: Secondary | ICD-10-CM | POA: Diagnosis not present

## 2017-08-03 DIAGNOSIS — D225 Melanocytic nevi of trunk: Secondary | ICD-10-CM | POA: Diagnosis not present

## 2017-08-03 DIAGNOSIS — Z1283 Encounter for screening for malignant neoplasm of skin: Secondary | ICD-10-CM | POA: Diagnosis not present

## 2019-02-08 IMAGING — DX DG WRIST COMPLETE 3+V*L*
4 series · 4 of 4 positions shown · non-contrast
Comparison: None.

CLINICAL DATA: Wrist pain and swelling, tripped over a log while
walking in the yard today

EXAM:
LEFT WRIST - COMPLETE 3+ VIEW

[wrist ap]
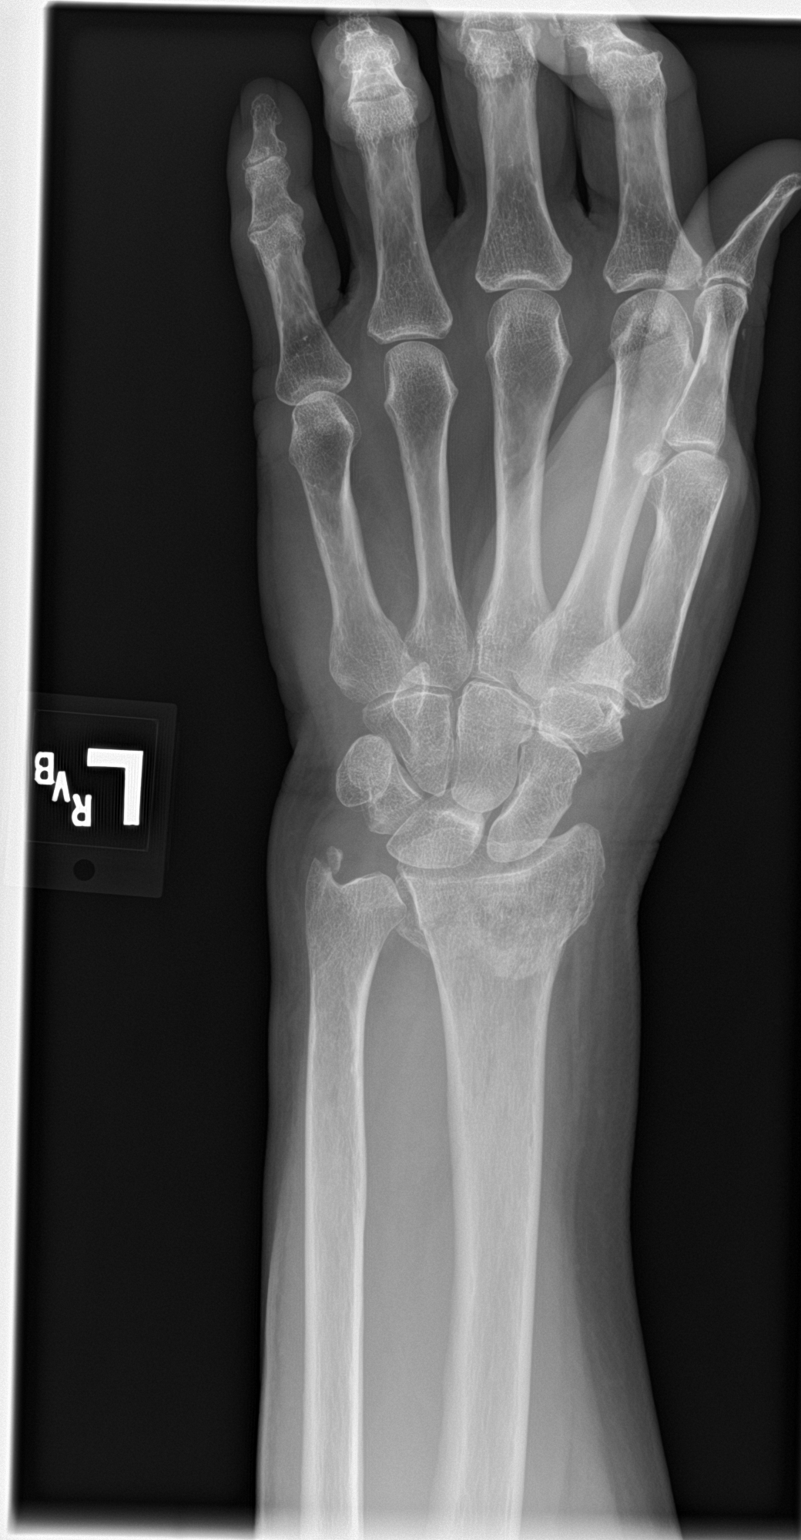

[wrist obl (1 of 2)]
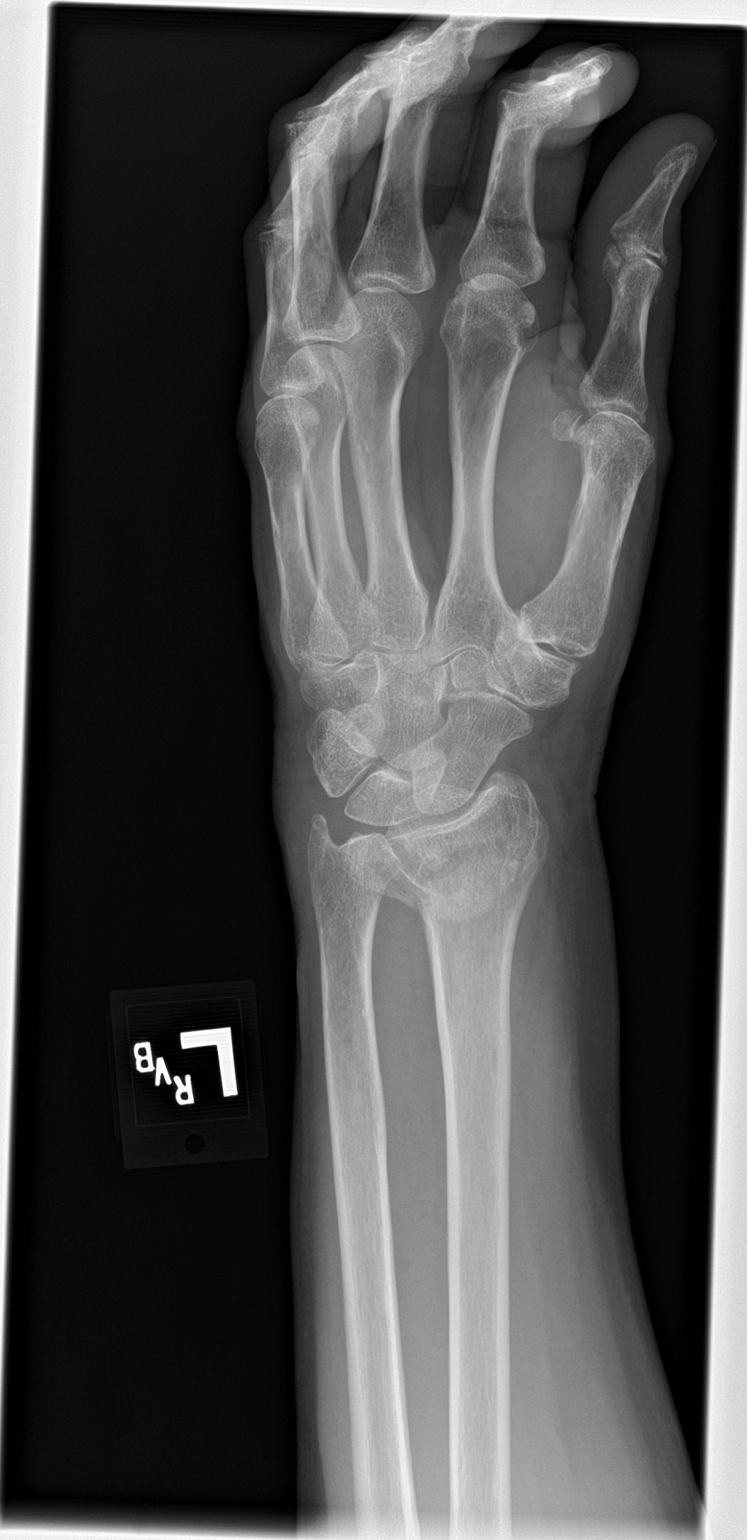

[wrist lat]
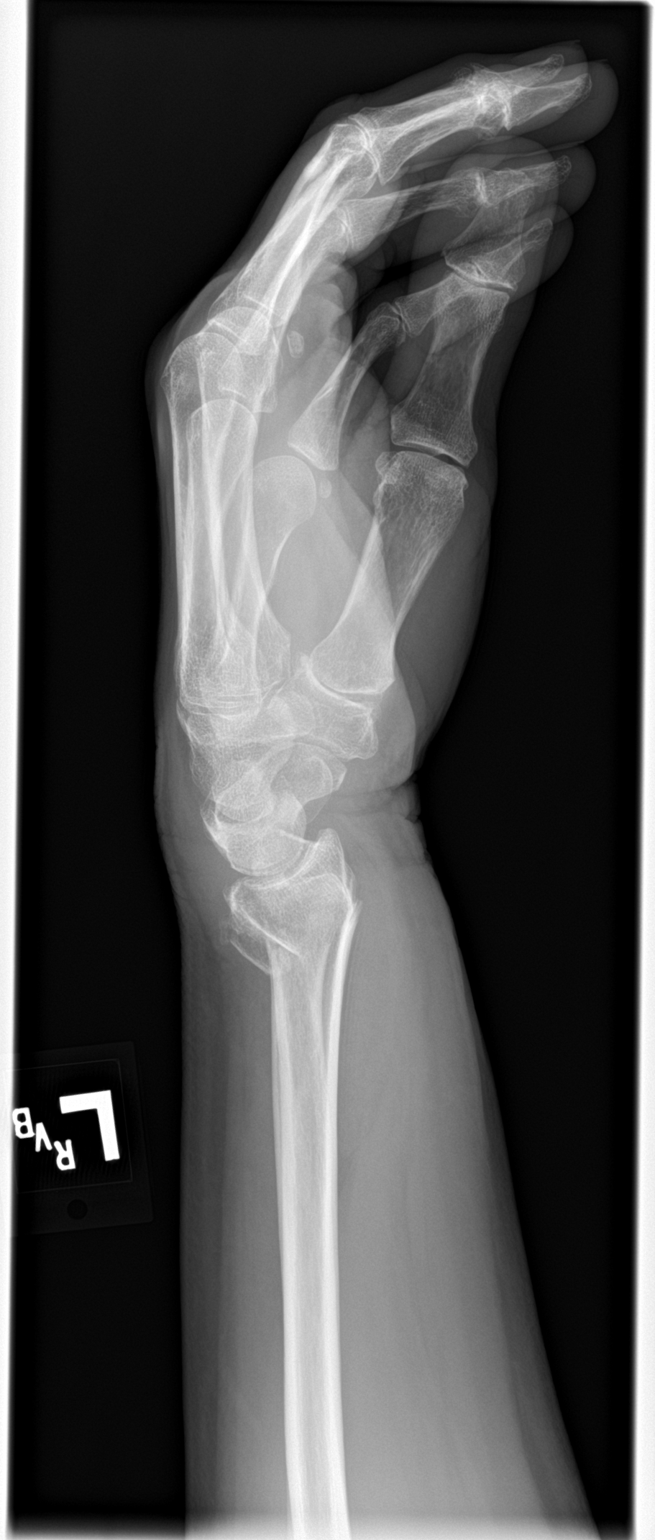

[wrist obl (2 of 2)]
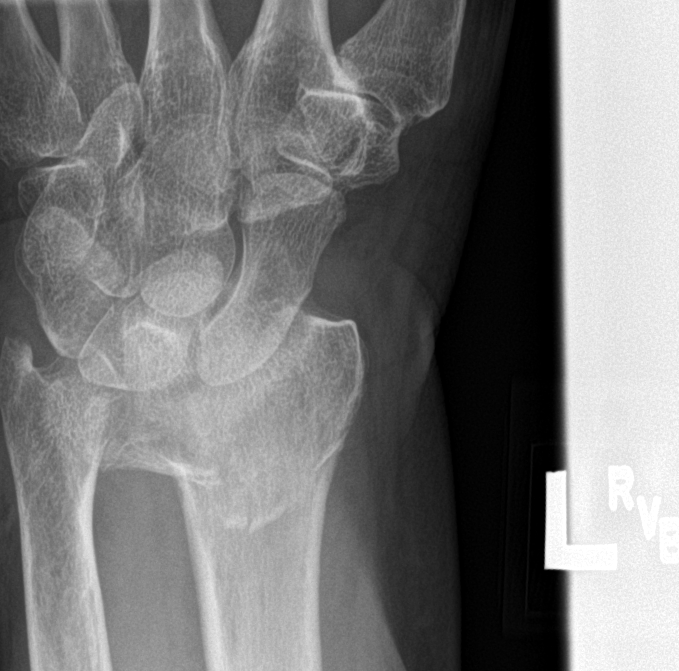

[4 of 4 positions shown; findings below may reference images not displayed]

FINDINGS: Diffuse osseous demineralization.

Displaced ulnar styloid fracture.

Transverse diaphyseal fracture distal LEFT radius with minimal
dorsal displacement and apex volar angulation.

Associated soft tissue swelling.

No additional fracture, dislocation, or bone destruction.
IMPRESSION: Minimally displaced ulnar styloid fracture.

Minimally displaced and angulated distal LEFT radial metaphyseal
fracture.
